# Patient Record
Sex: Male | Born: 1985 | Race: White | Hispanic: No | Marital: Single | Smoking: Never smoker
Health system: Southern US, Community
[De-identification: ages and names within clinical notes are randomized; demographics above are authoritative.]

---

## 2014-05-24 ENCOUNTER — Emergency Department: Payer: Self-pay | Admitting: Emergency Medicine

## 2014-05-24 LAB — CBC WITH DIFFERENTIAL/PLATELET
Basophil #: 0.1 10*3/uL (ref 0.0–0.1)
Basophil %: 0.9 %
EOS ABS: 0.4 10*3/uL (ref 0.0–0.7)
Eosinophil %: 3.6 %
HCT: 44.7 % (ref 40.0–52.0)
HGB: 14.5 g/dL (ref 13.0–18.0)
Lymphocyte #: 4.7 10*3/uL — ABNORMAL HIGH (ref 1.0–3.6)
Lymphocyte %: 45.6 %
MCH: 30.7 pg (ref 26.0–34.0)
MCHC: 32.4 g/dL (ref 32.0–36.0)
MCV: 95 fL (ref 80–100)
MONO ABS: 0.7 x10 3/mm (ref 0.2–1.0)
Monocyte %: 6.8 %
Neutrophil #: 4.5 10*3/uL (ref 1.4–6.5)
Neutrophil %: 43.1 %
PLATELETS: 325 10*3/uL (ref 150–440)
RBC: 4.72 10*6/uL (ref 4.40–5.90)
RDW: 13.9 % (ref 11.5–14.5)
WBC: 10.4 10*3/uL (ref 3.8–10.6)

## 2014-05-24 LAB — BASIC METABOLIC PANEL
Anion Gap: 14 (ref 7–16)
BUN: 16 mg/dL (ref 7–18)
CREATININE: 1.25 mg/dL (ref 0.60–1.30)
Calcium, Total: 9 mg/dL (ref 8.5–10.1)
Chloride: 105 mmol/L (ref 98–107)
Co2: 20 mmol/L — ABNORMAL LOW (ref 21–32)
EGFR (Non-African Amer.): >60
Glucose: 147 mg/dL — ABNORMAL HIGH (ref 65–99)
Osmolality: 281 (ref 275–301)
POTASSIUM: 3.4 mmol/L — AB (ref 3.5–5.1)
Sodium: 139 mmol/L (ref 136–145)

## 2021-02-13 ENCOUNTER — Emergency Department
Admission: EM | Admit: 2021-02-13 | Discharge: 2021-02-14 | Disposition: A | Discharge status: Home / Self Care | Payer: 59 | Attending: Emergency Medicine | Admitting: Emergency Medicine

## 2021-02-13 ENCOUNTER — Emergency Department: Payer: 59

## 2021-02-13 ENCOUNTER — Encounter: Payer: Self-pay | Admitting: Emergency Medicine

## 2021-02-13 DIAGNOSIS — W19XXXA Unspecified fall, initial encounter: Secondary | ICD-10-CM

## 2021-02-13 DIAGNOSIS — S0003XA Contusion of scalp, initial encounter: Secondary | ICD-10-CM | POA: Insufficient documentation

## 2021-02-13 DIAGNOSIS — S32048A Other fracture of fourth lumbar vertebra, initial encounter for closed fracture: Secondary | ICD-10-CM | POA: Diagnosis not present

## 2021-02-13 DIAGNOSIS — S32038A Other fracture of third lumbar vertebra, initial encounter for closed fracture: Secondary | ICD-10-CM | POA: Diagnosis not present

## 2021-02-13 DIAGNOSIS — S50811A Abrasion of right forearm, initial encounter: Secondary | ICD-10-CM | POA: Diagnosis not present

## 2021-02-13 DIAGNOSIS — S32028A Other fracture of second lumbar vertebra, initial encounter for closed fracture: Secondary | ICD-10-CM | POA: Insufficient documentation

## 2021-02-13 DIAGNOSIS — W1789XA Other fall from one level to another, initial encounter: Secondary | ICD-10-CM | POA: Insufficient documentation

## 2021-02-13 DIAGNOSIS — S32009A Unspecified fracture of unspecified lumbar vertebra, initial encounter for closed fracture: Secondary | ICD-10-CM

## 2021-02-13 DIAGNOSIS — S0990XA Unspecified injury of head, initial encounter: Secondary | ICD-10-CM

## 2021-02-13 DIAGNOSIS — S3992XA Unspecified injury of lower back, initial encounter: Secondary | ICD-10-CM | POA: Diagnosis present

## 2021-02-13 DIAGNOSIS — Y92812 Truck as the place of occurrence of the external cause: Secondary | ICD-10-CM | POA: Diagnosis not present

## 2021-02-13 DIAGNOSIS — S32018A Other fracture of first lumbar vertebra, initial encounter for closed fracture: Secondary | ICD-10-CM | POA: Insufficient documentation

## 2021-02-13 DIAGNOSIS — S32058A Other fracture of fifth lumbar vertebra, initial encounter for closed fracture: Secondary | ICD-10-CM | POA: Insufficient documentation

## 2021-02-13 DIAGNOSIS — S50812A Abrasion of left forearm, initial encounter: Secondary | ICD-10-CM | POA: Insufficient documentation

## 2021-02-13 DIAGNOSIS — M5442 Lumbago with sciatica, left side: Secondary | ICD-10-CM

## 2021-02-13 LAB — COMPREHENSIVE METABOLIC PANEL
ALT: 27 U/L (ref 0–44)
AST: 28 U/L (ref 15–41)
Albumin: 4.6 g/dL (ref 3.5–5.0)
Alkaline Phosphatase: 80 U/L (ref 38–126)
Anion gap: 11 (ref 5–15)
BUN: 18 mg/dL (ref 6–20)
CO2: 25 mmol/L (ref 22–32)
Calcium: 9.3 mg/dL (ref 8.9–10.3)
Chloride: 101 mmol/L (ref 98–111)
Creatinine, Ser: 1.19 mg/dL (ref 0.61–1.24)
GFR, Estimated: >60 mL/min (ref >60)
Glucose, Bld: 205 mg/dL — ABNORMAL HIGH (ref 70–99)
Potassium: 3.8 mmol/L (ref 3.5–5.1)
Sodium: 137 mmol/L (ref 135–145)
Total Bilirubin: 0.7 mg/dL (ref 0.3–1.2)
Total Protein: 7.6 g/dL (ref 6.5–8.1)

## 2021-02-13 LAB — CBC
HCT: 44.7 % (ref 39.0–52.0)
Hemoglobin: 14.8 g/dL (ref 13.0–17.0)
MCH: 31 pg (ref 26.0–34.0)
MCHC: 33.1 g/dL (ref 30.0–36.0)
MCV: 93.5 fL (ref 80.0–100.0)
Platelets: 306 10*3/uL (ref 150–400)
RBC: 4.78 MIL/uL (ref 4.22–5.81)
RDW: 12.6 % (ref 11.5–15.5)
WBC: 13.2 10*3/uL — ABNORMAL HIGH (ref 4.0–10.5)
nRBC: 0 % (ref 0.0–0.2)

## 2021-02-13 LAB — TYPE AND SCREEN
ABO/RH(D): A POS
Antibody Screen: NEGATIVE

## 2021-02-13 MED ORDER — IOHEXOL 300 MG/ML  SOLN
125.0000 mL | Freq: Once | INTRAMUSCULAR | Status: AC | PRN
  Administered 2021-02-13: 125 mL via INTRAVENOUS

## 2021-02-13 MED ORDER — OXYCODONE-ACETAMINOPHEN 5-325 MG PO TABS
1.0000 | ORAL_TABLET | Freq: Once | ORAL | Status: AC
  Administered 2021-02-14: 1 via ORAL
  Filled 2021-02-13: qty 1

## 2021-02-13 MED ORDER — IBUPROFEN 800 MG PO TABS: 800.0000 mg | ORAL_TABLET | Freq: Three times a day (TID) | ORAL | 0 refills | Status: DC | PRN

## 2021-02-13 MED ORDER — MORPHINE SULFATE (PF) 4 MG/ML IV SOLN
4.0000 mg | Freq: Once | INTRAVENOUS | Status: AC
  Administered 2021-02-13: 4 mg via INTRAVENOUS
  Filled 2021-02-13: qty 1

## 2021-02-13 MED ORDER — OXYCODONE-ACETAMINOPHEN 5-325 MG PO TABS: 1.0000 | ORAL_TABLET | ORAL | 0 refills | Status: DC | PRN

## 2021-02-13 MED ORDER — MORPHINE SULFATE (PF) 4 MG/ML IV SOLN
6.0000 mg | Freq: Once | INTRAVENOUS | Status: AC
  Administered 2021-02-13: 6 mg via INTRAVENOUS
  Filled 2021-02-13: qty 2

## 2021-02-13 NOTE — Discharge Instructions (Signed)
1.  You may take pain medicines as needed (Motrin/Percocet). 2.  Wear brace when out of bed. 3.  Return to the ER for worsening symptoms, persistent vomiting, difficulty breathing, extremity weakness/numbness/tingling, or other concerns.

## 2021-02-13 NOTE — Progress Notes (Signed)
Orthopedic Tech Progress Note Patient Details:  [name] [birth year] [id number]  Patient ID: Anthony Martinez, male   DOB: [birth year], 35 y.o.   MRN: [id number] Called order into hanger.  Trinna Post 02/13/2021, 11:47 PM
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
## 2021-02-13 NOTE — ED Provider Notes (Signed)
-----------------------------------------   11:40 PM on 02/13/2021 -----------------------------------------  CT head/C-spine/chest abdomen pelvis discussed with Dr. Tessie Fass:  1. Acute displaced left L1 through L5 transverse process fractures.  The fracture at the L4 level may extend to the left pedicle.  Associated slight asymmetry of the left iliopsoas muscle compared to  the right with underlying hematoma formation not excluded.  2. No acute intracranial abnormality in a patient with a 6 mm right  occipital scalp hematoma with no underlying calvarial fracture.  3. No acute displaced fracture or traumatic listhesis of the  cervical spine.  4.  No acute traumatic injury to the chest, abdomen, or pelvis.  5. No acute fracture or traumatic malalignment of the thoracic  spine.   Spoke with Dr. Teola Bradley from neurosurgery who recommends TLSO brace when out of bed.  Updated patient and family member, offered admission for pain control.  Patient adamantly desires to be discharged home.  Will administer oral Percocet at this time while awaiting TLSO brace.   ----------------------------------------- 1:03 AM on 02/14/2021 -----------------------------------------  TLSO brace placed.  Patient ambulating without difficulty.  Strict precautions given.  Patient and family member verbalized understanding agree with plan of care.   Irean Hong, MD 02/14/21 [phone]
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
## 2021-02-20 ENCOUNTER — Ambulatory Visit: Payer: Self-pay | Admitting: Internal Medicine

## 2021-02-22 ENCOUNTER — Ambulatory Visit: Payer: 59 | Admitting: Nurse Practitioner

## 2021-02-22 ENCOUNTER — Telehealth: Payer: Self-pay | Admitting: Nurse Practitioner

## 2021-02-22 ENCOUNTER — Encounter: Payer: Self-pay | Admitting: Nurse Practitioner

## 2021-02-22 ENCOUNTER — Other Ambulatory Visit: Payer: Self-pay

## 2021-02-22 VITALS — BP 138/83 | HR 63 | Temp 99.0°F | Ht 70.63 in | Wt 251.0 lb

## 2021-02-22 DIAGNOSIS — Z7689 Persons encountering health services in other specified circumstances: Secondary | ICD-10-CM

## 2021-02-22 DIAGNOSIS — M4856XS Collapsed vertebra, not elsewhere classified, lumbar region, sequela of fracture: Secondary | ICD-10-CM

## 2021-02-22 MED ORDER — OXYCODONE-ACETAMINOPHEN 5-325 MG PO TABS: 1.0000 | ORAL_TABLET | Freq: Four times a day (QID) | ORAL | 0 refills | Status: AC | PRN

## 2021-02-22 NOTE — Assessment & Plan Note (Signed)
Recommend patient see Neurosurgery as soon as possible.  Advised patient to use brace that was provided by the ER.  Information provided on when to return to ER. Pain medication give today. PMP checked.
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
## 2021-02-22 NOTE — Patient Instructions (Signed)
Lumbar Spine Fracture A lumbar spine fracture is a break in one of the bones of the lower back. Lumbar spine fractures can vary from mild to severe. The most severe types are those that:  Cause the broken bones to move out of place (unstable).  Injure or press on the spinal cord. During recovery, it is normal to have pain and stiffness in the lower back for weeks. What are the causes? This condition may be caused by:  A fall.  A car accident.  A gunshot wound.  A hard, direct hit to the back. What increases the risk? You are more likely to develop this condition if:  You are in a situation that could result in a fall or other violent injury.  You have a condition that causes weakness in the bones (osteoporosis). What are the signs or symptoms? The main symptom of this condition is severe pain in the lower back. If a fracture is complex or severe, there may also be:  A misshapen or swollen area on the lower back.  Limited ability to move an area of the lower back.  Inability to empty the bladder (urinary retention).  Loss of bowel or bladder control (incontinence).  Loss of strength or sensation in the legs, feet, and toes.  Inability to move (paralysis). How is this diagnosed? This condition is diagnosed based on:  A physical exam.  Symptoms and what happened just before they developed.  The results of imaging tests, such as an X-ray, CT scan, or MRI. If your nerves have been damaged, you may also have other tests to find out the extent of the damage. How is this treated? Treatment for this condition depends on how severe the injury is. Most fractures can be treated with:  A back brace.  Bed rest and activity restrictions.  Pain medicine.  Physical therapy. Fractures that are complex, involve multiple bones, or make the spine unstable may require surgery. Surgery is done:  To remove pressure from the nerves or spinal cord.  To stabilize the broken pieces of  bone. Follow these instructions at home: Medicines  Take over-the-counter and prescription medicines only as told by your health care provider.  Do not drive or use heavy machinery while taking prescription pain medicine.  If you are taking prescription pain medicine, take actions to prevent or treat constipation. Your health care provider may recommend that you: ? Drink enough fluid to keep your urine pale yellow. ? Eat foods that are high in fiber, such as fresh fruits and vegetables, whole grains, and beans. ? Limit foods that are high in fat and processed sugars, such as fried or sweet foods. ? Take an over-the-counter or prescription medicine for constipation. If you have a brace:  Wear the back brace as told by your health care provider. Remove it only as told by your health care provider.  Keep the brace clean.  If the brace is not waterproof: ? Do not let it get wet. ? Cover it with a watertight covering when you take a bath or a shower. Activity  Stay in bed (on bed rest) only as directed by your health care provider.  Do exercises to improve motion and strength in your back (physical therapy), if your health care provider tells you to do so.  Return to your normal activities as directed by your health care provider. Ask your health care provider what activities are safe for you. Managing pain, stiffness, and swelling  If directed, put ice on  the injured area: ? If you have a removable brace, remove it as told by your health care provider. ? Put ice in a plastic bag. ? Place a towel between your skin and the bag. ? Leave the ice on for 20 minutes, 2-3 times a day.   General instructions  Do not use any products that contain nicotine or tobacco, such as cigarettes and e-cigarettes. These can delay healing after injury. If you need help quitting, ask your health care provider.  Do not drink alcohol. Alcohol can interfere with your treatment.  Keep all follow-up visits  as directed by your health care provider. This is important. ? Failing to follow up as recommended could result in permanent injury, disability, or long-lasting (chronic) pain. Contact a health care provider if:  You have a fever.  Your pain medicine is not helping.  Your pain does not get better over time.  You cannot return to your normal activities as planned or expected. Get help right away if:  You have difficulty breathing.  Your pain is very bad and it suddenly gets worse.  You have numbness, tingling, or weakness in any part of your body.  You are unable to empty your bladder.  You cannot control your bladder or bowels.  You are unable to move any body part (paralysis) that is below the level of your injury.  You vomit.  You have pain in your abdomen. Summary  A lumbar spine fracture is a break in one of the bones of the lower back.  The main symptom of this condition is severe pain in the lower back. If a fracture is complex, there may also be numbness, tingling, or paralysis in the legs.  Treatment depends on how severe the injury is. Most fractures can be treated with a back brace, bed rest and activity restrictions, pain medicine, and physical therapy.  Fractures that are complex, involve multiple bones, or make the spine unstable may require surgery. This information is not intended to replace advice given to you by your health care provider. Make sure you discuss any questions you have with your health care provider. Document Revised: 12/20/2017 Document Reviewed: 12/20/2017 Elsevier Patient Education  2021 [employer].

## 2021-02-22 NOTE — Telephone Encounter (Signed)
Please call patient and let him know that if he experiences any of these symptoms he should go back to the ER immediately: - A misshapen or swollen area on the lower back. -Limited ability to move an area of the lower back. -Inability to empty the bladder (urinary retention). -Loss of bowel or bladder control (incontinence). -Loss of strength or sensation in the legs, feet, and toes. -Inability to move (paralysis).  I added more information on his after visit summary which he can view on mychart also.

## 2021-02-22 NOTE — Telephone Encounter (Signed)
Message sent via mychart, will wait for response

## 2021-02-23 NOTE — Telephone Encounter (Signed)
Patient responded to mychart message

## 2021-07-18 ENCOUNTER — Encounter: Payer: 59 | Admitting: Nurse Practitioner
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
## 2024-03-31 ENCOUNTER — Ambulatory Visit: Payer: Self-pay | Admitting: Urology

## 2024-03-31 VITALS — BP 156/93 | HR 63 | Ht 72.0 in | Wt 273.4 lb

## 2024-03-31 DIAGNOSIS — Z3009 Encounter for other general counseling and advice on contraception: Secondary | ICD-10-CM

## 2024-03-31 NOTE — Progress Notes (Signed)
   03/31/24 12:59 PM   Anthony Martinez 1986-05-14 [id number]  CC: Discuss vasectomy  HPI: 38 year old male with 4 children interested in vasectomy for permanent sterilization.  He and his partner do not desire any further biologic pregnancies.    Family History: Family History  Problem Relation Age of Onset   Diabetes Mother    Diabetes Father    Hypertension Brother     Social History:  reports that he has never smoked. He has never used smokeless tobacco. He reports that he does not drink alcohol and does not use drugs.  Physical Exam: BP (!) 156/93 (BP Location: Left Arm, Patient Position: Sitting, Cuff Size: Large)   Pulse 63   Ht 6' (1.829 m)   Wt 273 lb 6.4 oz (124 kg)   SpO2 96%   BMI 37.08 kg/m    Constitutional:  Alert and oriented, No acute distress. Cardiovascular: No clubbing, cyanosis, or edema. Respiratory: Normal respiratory effort, no increased work of breathing. GI: Abdomen is soft, nontender, nondistended, no abdominal masses GU: Vas deferens palpable bilaterally, no testicular masses   Assessment & Plan:   38 year old male interested in vasectomy for permanent sterilization  We discussed the risks and benefits of vasectomy at length.  Vasectomy is intended to be a permanent form of contraception, and does not produce immediate sterility.  Following vasectomy another form of contraception is required until vas occlusion is confirmed by a post-vasectomy semen analysis obtained 2-3 months after the procedure.  Even after vas occlusion is confirmed, vasectomy is not 100% reliable in preventing pregnancy, and the failure rate is approximately 11/1998.  Repeat vasectomy is required in less than 1% of patients.  He should refrain from ejaculation for 1 week after vasectomy.  Options for fertility after vasectomy include vasectomy reversal, and sperm retrieval with in vitro fertilization or ICSI.  These options are not always successful and may be expensive.   Finally, there are other permanent and non-permanent alternatives to vasectomy available. There is no risk of erectile dysfunction, and the volume of semen will be similar to prior, as the majority of the ejaculate is from the prostate and seminal vesicles.   The procedure takes ~20 minutes.  We recommend patients take 5-10 mg of Valium 30 minutes prior, and he will need a driver post-procedure.  Local anesthetic is injected into the scrotal skin and a small segment of the vas deferens is removed, and the ends occluded. The complication rate is approximately 1-2%, and includes bleeding, infection, and development of chronic scrotal pain.  PLAN: Schedule vasectomy  Jay Meth, MD 03/31/2024  [facility] [address], [address] [place], [place] [address] [phone]

## 2024-03-31 NOTE — Patient Instructions (Addendum)
 Pre-Vasectomy Instructions  STOP all aspirin or blood thinners (Aspirin, Plavix, Coumadin, Warfarin, Motrin, Ibuprofen, Advil, Aleve, Naproxen, Naprosyn) for 7 days prior to the procedure.  If you have any questions about stopping these medications please contact your primary care physician or cardiologist.  Shave all hair from the upper scrotum on the day of the procedure.  This means just under the penis onto the scrotal sac.  The area shaved should measure about 2-3 inches around.  You may lather the scrotum with soap and water, and shave with a safety razor.  After shaving the area, thoroughly wash the penis and the scrotum, then shower or bathe to remove all the loose hairs.  If needed, wash the area again just before coming in for your Vasectomy.  It is recommended to have a light meal an hour or so prior to the procedure.  Bring a scrotal support (jock strap or suspensory, or tight jockey shorts or underwear).  Wear comfortable pants or shorts.  While the actual procedure usually takes about 45 minutes, you should be prepared to stay in the office for approximately one hour.  Bring someone with you to drive you home.  If you have any questions or concerns, please feel free to call the office at [phone].  Vasectomy Vasectomy is a procedure to cut and then tie or burn the ends of the vas deferens. The vas deferens is a tube that carries sperm from the testicle to the urethra. This procedure blocks sperm from being released during sex. This ensures that sperm does not go into the vagina. A vasectomy does not affect your ability to have sex or your desire for sex. Also, it does not prevent sexually transmitted infections, or STIs. Vasectomy is a permanent and effective form of birth control. You should have a vasectomy only when you and your partner are sure you do not want children in the future. Do not get this procedure when you are stressed, such as after divorce or pregnancy  loss. Tell a health care provider about: Any allergies you have. All medicines you are taking. These include vitamins, herbs, eye drops, creams, and over-the-counter medicines. Any problems you or family members have had with anesthesia. Any bleeding problems you have. Any surgeries you have had. Any medical conditions you have. What are the risks? Your provider will talk with you about risks. These may include: Infection. Bleeding and swelling of the scrotum. The scrotum is the sac that contains the testicles. Allergies to medicines. Failure of the procedure to prevent pregnancy. There is a very small chance that the tied or burned parts of the vas deferens may reconnect. If this happens, you could still make a person pregnant. Pain in the scrotum that goes on after you heal from the procedure. What happens before the procedure? Medicines Ask your health care provider about: Changing or stopping your regular medicines. These include any diabetes medicines or blood thinners you take. Taking medicines such as aspirin and ibuprofen. These medicines can thin your blood. Do not take them unless your provider tells you to take them. Taking over-the-counter medicines, vitamins, herbs, and supplements. You may be told to take a sedative a few hours before the procedure. A sedative helps you relax. Surgery safety Ask your provider: How your surgery site will be marked. What steps will be taken to help prevent infection. These steps may include: Removing hair at the surgery site. Washing skin with a soap that kills germs. Taking antibiotics. General instructions Do  not use any products that contain nicotine or tobacco for at least 4 weeks before the procedure. These products include cigarettes, chewing tobacco, and vaping devices, such as e-cigarettes. If you need help quitting, ask your provider. If you'll be going home right after the procedure, plan to have a responsible adult: Take you  home from the hospital or clinic. You'll not be allowed to drive. Care for you for the time you are told. What happens during the procedure?  You may be given: A sedative. This helps you relax. You may also be told to take this a few hours before the procedure. Anesthesia. This keeps you from feeling pain. It will numb certain areas of your body. Your provider will feel for your vas deferens. To get to the vas deferens, your provider may: Make a very small cut, or incision, in your scrotum. Make a hole by piercing the scrotum. Your vas deferens will be pulled out of your scrotum and cut. To close it, the cut ends of the vas deferens will be tied or burned. The vas deferens will be put back into your scrotum. The cut or the hole in the scrotum will be closed with stitches. The stitches will dissolve and will not need to be removed. The procedure will be done again on the other side of your scrotum. The procedure may vary among providers and hospitals. What happens after the procedure? You will be monitored to make sure that you do not have problems. You will be asked not to ejaculate for at least 1 week after the procedure, or for as long as you are told. You will need to use another form of birth control for 2-4 months after the procedure. Do this until your provider confirms that there's no sperm in your semen. You may be given something to wear to support your scrotum. This includes a jockstrap or underwear with a pouch. If you were given a sedative during your procedure, do not drive or use machines until your provider says that it's safe. This information is not intended to replace advice given to you by your health care provider. Make sure you discuss any questions you have with your health care provider. Document Revised: 01/07/2023 Document Reviewed: 01/07/2023 Elsevier Patient Education  2024 [employer].

## 2024-04-20 ENCOUNTER — Ambulatory Visit: Admitting: Urology

## 2024-04-20 VITALS — BP 144/84 | HR 64

## 2024-04-20 DIAGNOSIS — Z302 Encounter for sterilization: Secondary | ICD-10-CM

## 2024-04-20 NOTE — Patient Instructions (Signed)

## 2024-04-20 NOTE — Progress Notes (Signed)
 VASECTOMY PROCEDURE NOTE:  The patient was taken to the minor procedure room and placed in the supine position. His genitals were prepped and draped in the usual sterile fashion. The right vas deferens was brought up to the skin of the right upper scrotum. The skin overlying it was anesthetized with 1% lidocaine without epinephrine, anesthetic was also injected alongside the vas deferens in the direction of the inguinal canal. The no scalpel vasectomy instrument was used to make a small perforation in the scrotal skin. The vasectomy clamp was used to grasp the vas deferens. It was carefully dissected free from surrounding structures. A 1cm segment of the vas was removed, and the cut ends of the mucosa were cauterized. No significant bleeding was noted. The vas deferens was returned to the scrotum. The skin incision was closed with a simple interrupted stitch of 4-0 chromic.  Attention was then turned to the left side. The left vasectomy was performed in the same exact fashion. Sterile dressings were placed over each incision. The patient tolerated the procedure well.  IMPRESSION/DIAGNOSIS: The patient is a 38 year old gentleman who underwent a vasectomy today. Post-procedure instructions were reviewed. I stressed the importance of continuing to use birth control until he provides a semen specimen more than 2 months from now that demonstrates azoospermia.  We discussed return precautions including fever over 101, significant bleeding or hematoma, or uncontrolled pain. I also stressed the importance of avoiding strenuous activity for one week, no sexual activity or ejaculations for 5 days, intermittent icing over the next 48 hours, and scrotal support.   PLAN: The patient will be advised of his semen analysis results when available.  Jay Meth, MD 04/20/2024

## 2024-08-23 ENCOUNTER — Other Ambulatory Visit
